# Patient Record
Sex: Female | Born: 1980 | Race: White | Hispanic: No | Marital: Married | Smoking: Former smoker
Health system: Southern US, Community
[De-identification: ages and names within clinical notes are randomized; demographics above are authoritative.]

## PROBLEM LIST (undated history)

## (undated) DIAGNOSIS — E039 Hypothyroidism, unspecified: Secondary | ICD-10-CM

## (undated) HISTORY — DX: Hypothyroidism, unspecified: E03.9

## (undated) HISTORY — PX: WISDOM TOOTH EXTRACTION: SHX21

---

## 2019-05-21 NOTE — L&D Delivery Note (Signed)
Delivery Note At 1:06 PM a viable female was delivered via Vaginal, Spontaneous (Presentation: Left Occiput Anterior).  APGAR: 8, 9; weight pending.   Placenta status: Manual removal, Intact; sent to pathology.  Cord: 2 vessels with the following complications: loose nuchal x 1; reduced.  Cord pH: n/a  Anesthesia: Epidural Episiotomy:  n/a Lacerations: Perineal;Labial;1st degree Suture Repair: 3.0 vicryl rapide Est. Blood Loss (mL):  350  Mom to postpartum.  Baby to Couplet care / Skin to Skin.  Mitchel Honour 01/25/2020, 1:59 PM

## 2019-06-03 ENCOUNTER — Ambulatory Visit: Payer: BC Managed Care – PPO | Attending: Internal Medicine

## 2019-06-03 DIAGNOSIS — Z20822 Contact with and (suspected) exposure to covid-19: Secondary | ICD-10-CM

## 2019-06-04 LAB — NOVEL CORONAVIRUS, NAA: SARS-CoV-2, NAA: NOT DETECTED

## 2019-06-14 DIAGNOSIS — E669 Obesity, unspecified: Secondary | ICD-10-CM | POA: Diagnosis not present

## 2019-06-14 DIAGNOSIS — N911 Secondary amenorrhea: Secondary | ICD-10-CM | POA: Diagnosis not present

## 2019-06-14 DIAGNOSIS — E039 Hypothyroidism, unspecified: Secondary | ICD-10-CM | POA: Diagnosis not present

## 2019-06-14 DIAGNOSIS — O09511 Supervision of elderly primigravida, first trimester: Secondary | ICD-10-CM | POA: Diagnosis not present

## 2019-06-22 DIAGNOSIS — Z3481 Encounter for supervision of other normal pregnancy, first trimester: Secondary | ICD-10-CM | POA: Diagnosis not present

## 2019-06-22 DIAGNOSIS — Z3685 Encounter for antenatal screening for Streptococcus B: Secondary | ICD-10-CM | POA: Diagnosis not present

## 2019-06-22 LAB — OB RESULTS CONSOLE ABO/RH: RH Type: POSITIVE

## 2019-06-22 LAB — OB RESULTS CONSOLE HEPATITIS B SURFACE ANTIGEN: Hepatitis B Surface Ag: NEGATIVE

## 2019-06-22 LAB — OB RESULTS CONSOLE GC/CHLAMYDIA
Chlamydia: NEGATIVE
Gonorrhea: NEGATIVE

## 2019-06-22 LAB — OB RESULTS CONSOLE ANTIBODY SCREEN: Antibody Screen: NEGATIVE

## 2019-06-22 LAB — OB RESULTS CONSOLE HIV ANTIBODY (ROUTINE TESTING): HIV: NONREACTIVE

## 2019-06-22 LAB — OB RESULTS CONSOLE RUBELLA ANTIBODY, IGM: Rubella: IMMUNE

## 2019-06-22 LAB — OB RESULTS CONSOLE RPR: RPR: NONREACTIVE

## 2019-07-08 DIAGNOSIS — Z3481 Encounter for supervision of other normal pregnancy, first trimester: Secondary | ICD-10-CM | POA: Diagnosis not present

## 2019-07-08 DIAGNOSIS — Z124 Encounter for screening for malignant neoplasm of cervix: Secondary | ICD-10-CM | POA: Diagnosis not present

## 2019-07-08 DIAGNOSIS — Z331 Pregnant state, incidental: Secondary | ICD-10-CM | POA: Diagnosis not present

## 2019-07-08 DIAGNOSIS — Z113 Encounter for screening for infections with a predominantly sexual mode of transmission: Secondary | ICD-10-CM | POA: Diagnosis not present

## 2019-07-19 DIAGNOSIS — Z3682 Encounter for antenatal screening for nuchal translucency: Secondary | ICD-10-CM | POA: Diagnosis not present

## 2019-07-19 DIAGNOSIS — Z3A12 12 weeks gestation of pregnancy: Secondary | ICD-10-CM | POA: Diagnosis not present

## 2019-07-28 DIAGNOSIS — E039 Hypothyroidism, unspecified: Secondary | ICD-10-CM | POA: Diagnosis not present

## 2019-08-10 DIAGNOSIS — E039 Hypothyroidism, unspecified: Secondary | ICD-10-CM | POA: Diagnosis not present

## 2019-08-10 DIAGNOSIS — Z331 Pregnant state, incidental: Secondary | ICD-10-CM | POA: Diagnosis not present

## 2019-08-16 DIAGNOSIS — M9903 Segmental and somatic dysfunction of lumbar region: Secondary | ICD-10-CM | POA: Diagnosis not present

## 2019-08-16 DIAGNOSIS — M9902 Segmental and somatic dysfunction of thoracic region: Secondary | ICD-10-CM | POA: Diagnosis not present

## 2019-08-16 DIAGNOSIS — R293 Abnormal posture: Secondary | ICD-10-CM | POA: Diagnosis not present

## 2019-08-16 DIAGNOSIS — M62830 Muscle spasm of back: Secondary | ICD-10-CM | POA: Diagnosis not present

## 2019-08-17 DIAGNOSIS — R293 Abnormal posture: Secondary | ICD-10-CM | POA: Diagnosis not present

## 2019-08-17 DIAGNOSIS — M9902 Segmental and somatic dysfunction of thoracic region: Secondary | ICD-10-CM | POA: Diagnosis not present

## 2019-08-17 DIAGNOSIS — M9903 Segmental and somatic dysfunction of lumbar region: Secondary | ICD-10-CM | POA: Diagnosis not present

## 2019-08-17 DIAGNOSIS — M62830 Muscle spasm of back: Secondary | ICD-10-CM | POA: Diagnosis not present

## 2019-08-23 DIAGNOSIS — M62830 Muscle spasm of back: Secondary | ICD-10-CM | POA: Diagnosis not present

## 2019-08-23 DIAGNOSIS — M9903 Segmental and somatic dysfunction of lumbar region: Secondary | ICD-10-CM | POA: Diagnosis not present

## 2019-08-23 DIAGNOSIS — M9902 Segmental and somatic dysfunction of thoracic region: Secondary | ICD-10-CM | POA: Diagnosis not present

## 2019-08-23 DIAGNOSIS — R293 Abnormal posture: Secondary | ICD-10-CM | POA: Diagnosis not present

## 2019-08-24 DIAGNOSIS — E039 Hypothyroidism, unspecified: Secondary | ICD-10-CM | POA: Diagnosis not present

## 2019-08-26 DIAGNOSIS — M9902 Segmental and somatic dysfunction of thoracic region: Secondary | ICD-10-CM | POA: Diagnosis not present

## 2019-08-26 DIAGNOSIS — M9903 Segmental and somatic dysfunction of lumbar region: Secondary | ICD-10-CM | POA: Diagnosis not present

## 2019-08-26 DIAGNOSIS — R293 Abnormal posture: Secondary | ICD-10-CM | POA: Diagnosis not present

## 2019-08-26 DIAGNOSIS — M62830 Muscle spasm of back: Secondary | ICD-10-CM | POA: Diagnosis not present

## 2019-08-30 DIAGNOSIS — R293 Abnormal posture: Secondary | ICD-10-CM | POA: Diagnosis not present

## 2019-08-30 DIAGNOSIS — M9903 Segmental and somatic dysfunction of lumbar region: Secondary | ICD-10-CM | POA: Diagnosis not present

## 2019-08-30 DIAGNOSIS — M9902 Segmental and somatic dysfunction of thoracic region: Secondary | ICD-10-CM | POA: Diagnosis not present

## 2019-08-30 DIAGNOSIS — M62830 Muscle spasm of back: Secondary | ICD-10-CM | POA: Diagnosis not present

## 2019-09-03 DIAGNOSIS — Z3481 Encounter for supervision of other normal pregnancy, first trimester: Secondary | ICD-10-CM | POA: Diagnosis not present

## 2019-09-03 DIAGNOSIS — Z363 Encounter for antenatal screening for malformations: Secondary | ICD-10-CM | POA: Diagnosis not present

## 2019-09-03 DIAGNOSIS — Z3A18 18 weeks gestation of pregnancy: Secondary | ICD-10-CM | POA: Diagnosis not present

## 2019-09-06 DIAGNOSIS — R293 Abnormal posture: Secondary | ICD-10-CM | POA: Diagnosis not present

## 2019-09-06 DIAGNOSIS — M62830 Muscle spasm of back: Secondary | ICD-10-CM | POA: Diagnosis not present

## 2019-09-06 DIAGNOSIS — M9902 Segmental and somatic dysfunction of thoracic region: Secondary | ICD-10-CM | POA: Diagnosis not present

## 2019-09-06 DIAGNOSIS — M9903 Segmental and somatic dysfunction of lumbar region: Secondary | ICD-10-CM | POA: Diagnosis not present

## 2019-09-20 DIAGNOSIS — E039 Hypothyroidism, unspecified: Secondary | ICD-10-CM | POA: Diagnosis not present

## 2019-09-21 DIAGNOSIS — R293 Abnormal posture: Secondary | ICD-10-CM | POA: Diagnosis not present

## 2019-09-21 DIAGNOSIS — M9902 Segmental and somatic dysfunction of thoracic region: Secondary | ICD-10-CM | POA: Diagnosis not present

## 2019-09-21 DIAGNOSIS — M62830 Muscle spasm of back: Secondary | ICD-10-CM | POA: Diagnosis not present

## 2019-09-21 DIAGNOSIS — M9903 Segmental and somatic dysfunction of lumbar region: Secondary | ICD-10-CM | POA: Diagnosis not present

## 2019-09-29 DIAGNOSIS — Z362 Encounter for other antenatal screening follow-up: Secondary | ICD-10-CM | POA: Diagnosis not present

## 2019-09-29 DIAGNOSIS — Z3A22 22 weeks gestation of pregnancy: Secondary | ICD-10-CM | POA: Diagnosis not present

## 2019-10-05 ENCOUNTER — Encounter: Payer: Self-pay | Admitting: *Deleted

## 2019-10-05 DIAGNOSIS — R293 Abnormal posture: Secondary | ICD-10-CM | POA: Diagnosis not present

## 2019-10-05 DIAGNOSIS — M62830 Muscle spasm of back: Secondary | ICD-10-CM | POA: Diagnosis not present

## 2019-10-05 DIAGNOSIS — M9903 Segmental and somatic dysfunction of lumbar region: Secondary | ICD-10-CM | POA: Diagnosis not present

## 2019-10-05 DIAGNOSIS — M9902 Segmental and somatic dysfunction of thoracic region: Secondary | ICD-10-CM | POA: Diagnosis not present

## 2019-10-06 ENCOUNTER — Other Ambulatory Visit: Payer: Self-pay

## 2019-10-06 ENCOUNTER — Other Ambulatory Visit: Payer: Self-pay | Admitting: *Deleted

## 2019-10-06 ENCOUNTER — Other Ambulatory Visit: Payer: Self-pay | Admitting: Obstetrics and Gynecology

## 2019-10-06 ENCOUNTER — Ambulatory Visit: Payer: BC Managed Care – PPO | Attending: Obstetrics and Gynecology | Admitting: *Deleted

## 2019-10-06 ENCOUNTER — Ambulatory Visit (HOSPITAL_BASED_OUTPATIENT_CLINIC_OR_DEPARTMENT_OTHER): Payer: BC Managed Care – PPO

## 2019-10-06 VITALS — BP 118/82 | HR 100 | Ht 63.0 in

## 2019-10-06 DIAGNOSIS — O99212 Obesity complicating pregnancy, second trimester: Secondary | ICD-10-CM | POA: Diagnosis not present

## 2019-10-06 DIAGNOSIS — O359XX0 Maternal care for (suspected) fetal abnormality and damage, unspecified, not applicable or unspecified: Secondary | ICD-10-CM

## 2019-10-06 DIAGNOSIS — E669 Obesity, unspecified: Secondary | ICD-10-CM | POA: Insufficient documentation

## 2019-10-06 DIAGNOSIS — O0990 Supervision of high risk pregnancy, unspecified, unspecified trimester: Secondary | ICD-10-CM

## 2019-10-06 DIAGNOSIS — O99282 Endocrine, nutritional and metabolic diseases complicating pregnancy, second trimester: Secondary | ICD-10-CM | POA: Diagnosis not present

## 2019-10-06 DIAGNOSIS — Q600 Renal agenesis, unilateral: Secondary | ICD-10-CM

## 2019-10-06 DIAGNOSIS — E039 Hypothyroidism, unspecified: Secondary | ICD-10-CM

## 2019-10-06 DIAGNOSIS — Z3A23 23 weeks gestation of pregnancy: Secondary | ICD-10-CM | POA: Insufficient documentation

## 2019-10-06 DIAGNOSIS — O358XX0 Maternal care for other (suspected) fetal abnormality and damage, not applicable or unspecified: Secondary | ICD-10-CM | POA: Diagnosis not present

## 2019-10-11 ENCOUNTER — Ambulatory Visit: Payer: BC Managed Care – PPO

## 2019-11-01 DIAGNOSIS — Z23 Encounter for immunization: Secondary | ICD-10-CM | POA: Diagnosis not present

## 2019-11-01 DIAGNOSIS — Z3480 Encounter for supervision of other normal pregnancy, unspecified trimester: Secondary | ICD-10-CM | POA: Diagnosis not present

## 2019-12-01 ENCOUNTER — Ambulatory Visit: Payer: BC Managed Care – PPO | Admitting: *Deleted

## 2019-12-01 ENCOUNTER — Ambulatory Visit: Payer: BC Managed Care – PPO | Attending: Maternal & Fetal Medicine

## 2019-12-01 ENCOUNTER — Other Ambulatory Visit: Payer: Self-pay

## 2019-12-01 VITALS — BP 120/81 | HR 103

## 2019-12-01 DIAGNOSIS — O99283 Endocrine, nutritional and metabolic diseases complicating pregnancy, third trimester: Secondary | ICD-10-CM

## 2019-12-01 DIAGNOSIS — E039 Hypothyroidism, unspecified: Secondary | ICD-10-CM | POA: Diagnosis not present

## 2019-12-01 DIAGNOSIS — Q600 Renal agenesis, unilateral: Secondary | ICD-10-CM | POA: Diagnosis not present

## 2019-12-01 DIAGNOSIS — O0990 Supervision of high risk pregnancy, unspecified, unspecified trimester: Secondary | ICD-10-CM | POA: Insufficient documentation

## 2019-12-01 DIAGNOSIS — O359XX0 Maternal care for (suspected) fetal abnormality and damage, unspecified, not applicable or unspecified: Secondary | ICD-10-CM | POA: Diagnosis not present

## 2019-12-01 DIAGNOSIS — Z362 Encounter for other antenatal screening follow-up: Secondary | ICD-10-CM | POA: Diagnosis not present

## 2019-12-01 DIAGNOSIS — O99213 Obesity complicating pregnancy, third trimester: Secondary | ICD-10-CM

## 2019-12-01 DIAGNOSIS — Z3A31 31 weeks gestation of pregnancy: Secondary | ICD-10-CM

## 2019-12-08 DIAGNOSIS — E039 Hypothyroidism, unspecified: Secondary | ICD-10-CM | POA: Diagnosis not present

## 2019-12-28 DIAGNOSIS — E039 Hypothyroidism, unspecified: Secondary | ICD-10-CM | POA: Diagnosis not present

## 2020-01-04 DIAGNOSIS — Z3685 Encounter for antenatal screening for Streptococcus B: Secondary | ICD-10-CM | POA: Diagnosis not present

## 2020-01-04 DIAGNOSIS — O26849 Uterine size-date discrepancy, unspecified trimester: Secondary | ICD-10-CM | POA: Diagnosis not present

## 2020-01-04 DIAGNOSIS — Z3A36 36 weeks gestation of pregnancy: Secondary | ICD-10-CM | POA: Diagnosis not present

## 2020-01-04 LAB — OB RESULTS CONSOLE GBS: GBS: NEGATIVE

## 2020-01-07 DIAGNOSIS — E039 Hypothyroidism, unspecified: Secondary | ICD-10-CM | POA: Diagnosis not present

## 2020-01-07 DIAGNOSIS — E063 Autoimmune thyroiditis: Secondary | ICD-10-CM | POA: Diagnosis not present

## 2020-01-07 DIAGNOSIS — Z331 Pregnant state, incidental: Secondary | ICD-10-CM | POA: Diagnosis not present

## 2020-01-18 ENCOUNTER — Encounter (HOSPITAL_COMMUNITY): Payer: Self-pay | Admitting: *Deleted

## 2020-01-18 ENCOUNTER — Telehealth (HOSPITAL_COMMUNITY): Payer: Self-pay | Admitting: *Deleted

## 2020-01-18 NOTE — Telephone Encounter (Signed)
Preadmission screen  

## 2020-01-22 ENCOUNTER — Other Ambulatory Visit (HOSPITAL_COMMUNITY)
Admission: RE | Admit: 2020-01-22 | Discharge: 2020-01-22 | Disposition: A | Discharge status: Home / Self Care | Payer: BC Managed Care – PPO | Source: Ambulatory Visit | Attending: Obstetrics and Gynecology | Admitting: Obstetrics and Gynecology

## 2020-01-22 DIAGNOSIS — Z3A00 Weeks of gestation of pregnancy not specified: Secondary | ICD-10-CM | POA: Diagnosis not present

## 2020-01-22 DIAGNOSIS — O99284 Endocrine, nutritional and metabolic diseases complicating childbirth: Secondary | ICD-10-CM | POA: Diagnosis not present

## 2020-01-22 DIAGNOSIS — Z91041 Radiographic dye allergy status: Secondary | ICD-10-CM | POA: Diagnosis not present

## 2020-01-22 DIAGNOSIS — O99214 Obesity complicating childbirth: Secondary | ICD-10-CM | POA: Diagnosis not present

## 2020-01-22 DIAGNOSIS — O26893 Other specified pregnancy related conditions, third trimester: Secondary | ICD-10-CM | POA: Diagnosis not present

## 2020-01-22 DIAGNOSIS — O43893 Other placental disorders, third trimester: Secondary | ICD-10-CM | POA: Diagnosis not present

## 2020-01-22 DIAGNOSIS — Z3A39 39 weeks gestation of pregnancy: Secondary | ICD-10-CM | POA: Diagnosis not present

## 2020-01-22 DIAGNOSIS — Z01812 Encounter for preprocedural laboratory examination: Secondary | ICD-10-CM | POA: Insufficient documentation

## 2020-01-22 DIAGNOSIS — O358XX0 Maternal care for other (suspected) fetal abnormality and damage, not applicable or unspecified: Secondary | ICD-10-CM | POA: Diagnosis not present

## 2020-01-22 DIAGNOSIS — Z20822 Contact with and (suspected) exposure to covid-19: Secondary | ICD-10-CM | POA: Diagnosis not present

## 2020-01-22 DIAGNOSIS — Z87891 Personal history of nicotine dependence: Secondary | ICD-10-CM | POA: Diagnosis not present

## 2020-01-22 DIAGNOSIS — E039 Hypothyroidism, unspecified: Secondary | ICD-10-CM | POA: Diagnosis not present

## 2020-01-22 LAB — SARS CORONAVIRUS 2 (TAT 6-24 HRS): SARS Coronavirus 2: NEGATIVE

## 2020-01-25 ENCOUNTER — Inpatient Hospital Stay (HOSPITAL_COMMUNITY): Payer: BC Managed Care – PPO

## 2020-01-25 ENCOUNTER — Inpatient Hospital Stay (HOSPITAL_COMMUNITY): Payer: BC Managed Care – PPO | Admitting: Anesthesiology

## 2020-01-25 ENCOUNTER — Other Ambulatory Visit: Payer: Self-pay

## 2020-01-25 ENCOUNTER — Inpatient Hospital Stay (HOSPITAL_COMMUNITY)
Admission: AD | Admit: 2020-01-25 | Discharge: 2020-01-27 | DRG: 807 | Disposition: A | Discharge status: Home / Self Care | Payer: BC Managed Care – PPO | Attending: Obstetrics & Gynecology | Admitting: Obstetrics & Gynecology

## 2020-01-25 ENCOUNTER — Encounter (HOSPITAL_COMMUNITY): Payer: Self-pay | Admitting: Obstetrics & Gynecology

## 2020-01-25 DIAGNOSIS — Z87891 Personal history of nicotine dependence: Secondary | ICD-10-CM | POA: Diagnosis not present

## 2020-01-25 DIAGNOSIS — O99284 Endocrine, nutritional and metabolic diseases complicating childbirth: Secondary | ICD-10-CM | POA: Diagnosis present

## 2020-01-25 DIAGNOSIS — E039 Hypothyroidism, unspecified: Secondary | ICD-10-CM | POA: Diagnosis present

## 2020-01-25 DIAGNOSIS — Z20822 Contact with and (suspected) exposure to covid-19: Secondary | ICD-10-CM | POA: Diagnosis present

## 2020-01-25 DIAGNOSIS — Z91041 Radiographic dye allergy status: Secondary | ICD-10-CM

## 2020-01-25 DIAGNOSIS — Z3A39 39 weeks gestation of pregnancy: Secondary | ICD-10-CM | POA: Diagnosis not present

## 2020-01-25 DIAGNOSIS — O26893 Other specified pregnancy related conditions, third trimester: Secondary | ICD-10-CM | POA: Diagnosis present

## 2020-01-25 DIAGNOSIS — O358XX0 Maternal care for other (suspected) fetal abnormality and damage, not applicable or unspecified: Principal | ICD-10-CM | POA: Diagnosis present

## 2020-01-25 DIAGNOSIS — O99214 Obesity complicating childbirth: Secondary | ICD-10-CM | POA: Diagnosis present

## 2020-01-25 DIAGNOSIS — Z3490 Encounter for supervision of normal pregnancy, unspecified, unspecified trimester: Secondary | ICD-10-CM

## 2020-01-25 LAB — CBC
HCT: 35.9 % — ABNORMAL LOW (ref 36.0–46.0)
Hemoglobin: 12.4 g/dL (ref 12.0–15.0)
MCH: 30.2 pg (ref 26.0–34.0)
MCHC: 34.5 g/dL (ref 30.0–36.0)
MCV: 87.6 fL (ref 80.0–100.0)
Platelets: 292 10*3/uL (ref 150–400)
RBC: 4.1 MIL/uL (ref 3.87–5.11)
RDW: 13 % (ref 11.5–15.5)
WBC: 11 10*3/uL — ABNORMAL HIGH (ref 4.0–10.5)
nRBC: 0 % (ref 0.0–0.2)

## 2020-01-25 LAB — TYPE AND SCREEN
ABO/RH(D): A POS
Antibody Screen: NEGATIVE

## 2020-01-25 LAB — RPR: RPR Ser Ql: NONREACTIVE

## 2020-01-25 MED ORDER — OXYTOCIN-SODIUM CHLORIDE 30-0.9 UT/500ML-% IV SOLN: 2.5000 [IU]/h | INTRAVENOUS | Status: DC

## 2020-01-25 MED ORDER — PHENYLEPHRINE 40 MCG/ML (10ML) SYRINGE FOR IV PUSH (FOR BLOOD PRESSURE SUPPORT): 80.0000 ug | PREFILLED_SYRINGE | INTRAVENOUS | Status: DC | PRN

## 2020-01-25 MED ORDER — IBUPROFEN 600 MG PO TABS
600.0000 mg | ORAL_TABLET | Freq: Four times a day (QID) | ORAL | Status: DC
  Administered 2020-01-25 – 2020-01-27 (×8): 600 mg via ORAL
  Filled 2020-01-25 (×8): qty 1

## 2020-01-25 MED ORDER — OXYCODONE-ACETAMINOPHEN 5-325 MG PO TABS: 1.0000 | ORAL_TABLET | ORAL | Status: DC | PRN

## 2020-01-25 MED ORDER — LACTATED RINGERS IV SOLN: INTRAVENOUS | Status: DC

## 2020-01-25 MED ORDER — LEVOTHYROXINE SODIUM 75 MCG PO TABS
150.0000 ug | ORAL_TABLET | Freq: Every day | ORAL | Status: DC
  Administered 2020-01-26 – 2020-01-27 (×2): 150 ug via ORAL
  Filled 2020-01-25 (×2): qty 2

## 2020-01-25 MED ORDER — ACETAMINOPHEN 325 MG PO TABS
650.0000 mg | ORAL_TABLET | ORAL | Status: DC | PRN
  Administered 2020-01-25 – 2020-01-26 (×3): 650 mg via ORAL
  Filled 2020-01-25 (×3): qty 2

## 2020-01-25 MED ORDER — ACETAMINOPHEN 325 MG PO TABS: 650.0000 mg | ORAL_TABLET | ORAL | Status: DC | PRN

## 2020-01-25 MED ORDER — OXYCODONE-ACETAMINOPHEN 5-325 MG PO TABS: 2.0000 | ORAL_TABLET | ORAL | Status: DC | PRN

## 2020-01-25 MED ORDER — DIPHENHYDRAMINE HCL 50 MG/ML IJ SOLN: 12.5000 mg | INTRAMUSCULAR | Status: DC | PRN

## 2020-01-25 MED ORDER — EPHEDRINE 5 MG/ML INJ: 10.0000 mg | INTRAVENOUS | Status: DC | PRN

## 2020-01-25 MED ORDER — SENNOSIDES-DOCUSATE SODIUM 8.6-50 MG PO TABS
2.0000 | ORAL_TABLET | ORAL | Status: DC
  Administered 2020-01-26 (×2): 2 via ORAL
  Filled 2020-01-25 (×2): qty 2

## 2020-01-25 MED ORDER — OXYTOCIN BOLUS FROM INFUSION
333.0000 mL | Freq: Once | INTRAVENOUS | Status: AC
  Administered 2020-01-25: 333 mL via INTRAVENOUS

## 2020-01-25 MED ORDER — DIBUCAINE (PERIANAL) 1 % EX OINT: 1.0000 "application " | TOPICAL_OINTMENT | CUTANEOUS | Status: DC | PRN

## 2020-01-25 MED ORDER — LEVOTHYROXINE SODIUM 75 MCG PO TABS
150.0000 ug | ORAL_TABLET | Freq: Every morning | ORAL | Status: DC
  Administered 2020-01-25: 150 ug via ORAL
  Filled 2020-01-25: qty 1

## 2020-01-25 MED ORDER — FENTANYL CITRATE (PF) 100 MCG/2ML IJ SOLN: 50.0000 ug | INTRAMUSCULAR | Status: DC | PRN

## 2020-01-25 MED ORDER — COCONUT OIL OIL
1.0000 "application " | TOPICAL_OIL | Status: DC | PRN
  Administered 2020-01-26: 1 via TOPICAL

## 2020-01-25 MED ORDER — DIPHENHYDRAMINE HCL 25 MG PO CAPS: 25.0000 mg | ORAL_CAPSULE | Freq: Four times a day (QID) | ORAL | Status: DC | PRN

## 2020-01-25 MED ORDER — BUPIVACAINE HCL (PF) 0.75 % IJ SOLN
INTRAMUSCULAR | Status: DC | PRN
Start: 2020-01-25 — End: 2020-01-25
  Administered 2020-01-25: 12 mL/h via EPIDURAL

## 2020-01-25 MED ORDER — PRENATAL MULTIVITAMIN CH
1.0000 | ORAL_TABLET | Freq: Every day | ORAL | Status: DC
  Administered 2020-01-26 – 2020-01-27 (×2): 1 via ORAL
  Filled 2020-01-25 (×2): qty 1

## 2020-01-25 MED ORDER — BENZOCAINE-MENTHOL 20-0.5 % EX AERO
1.0000 "application " | INHALATION_SPRAY | CUTANEOUS | Status: DC | PRN
  Administered 2020-01-25: 1 via TOPICAL
  Filled 2020-01-25: qty 56

## 2020-01-25 MED ORDER — ONDANSETRON HCL 4 MG PO TABS: 4.0000 mg | ORAL_TABLET | ORAL | Status: DC | PRN

## 2020-01-25 MED ORDER — WITCH HAZEL-GLYCERIN EX PADS: 1.0000 "application " | MEDICATED_PAD | CUTANEOUS | Status: DC | PRN

## 2020-01-25 MED ORDER — LACTATED RINGERS IV SOLN: 500.0000 mL | INTRAVENOUS | Status: DC | PRN

## 2020-01-25 MED ORDER — TETANUS-DIPHTH-ACELL PERTUSSIS 5-2.5-18.5 LF-MCG/0.5 IM SUSP: 0.5000 mL | Freq: Once | INTRAMUSCULAR | Status: DC

## 2020-01-25 MED ORDER — MISOPROSTOL 25 MCG QUARTER TABLET
25.0000 ug | ORAL_TABLET | ORAL | Status: DC | PRN
  Administered 2020-01-25 (×2): 25 ug via VAGINAL
  Filled 2020-01-25 (×2): qty 1

## 2020-01-25 MED ORDER — SIMETHICONE 80 MG PO CHEW: 80.0000 mg | CHEWABLE_TABLET | ORAL | Status: DC | PRN

## 2020-01-25 MED ORDER — OXYTOCIN-SODIUM CHLORIDE 30-0.9 UT/500ML-% IV SOLN
1.0000 m[IU]/min | INTRAVENOUS | Status: DC
  Administered 2020-01-25: 2 m[IU]/min via INTRAVENOUS
  Filled 2020-01-25: qty 500

## 2020-01-25 MED ORDER — ZOLPIDEM TARTRATE 5 MG PO TABS: 5.0000 mg | ORAL_TABLET | Freq: Every evening | ORAL | Status: DC | PRN

## 2020-01-25 MED ORDER — ONDANSETRON HCL 4 MG/2ML IJ SOLN: 4.0000 mg | Freq: Four times a day (QID) | INTRAMUSCULAR | Status: DC | PRN

## 2020-01-25 MED ORDER — ONDANSETRON HCL 4 MG/2ML IJ SOLN: 4.0000 mg | INTRAMUSCULAR | Status: DC | PRN

## 2020-01-25 MED ORDER — TERBUTALINE SULFATE 1 MG/ML IJ SOLN: 0.2500 mg | Freq: Once | INTRAMUSCULAR | Status: DC | PRN

## 2020-01-25 MED ORDER — LIDOCAINE HCL (PF) 1 % IJ SOLN
INTRAMUSCULAR | Status: DC | PRN
  Administered 2020-01-25: 8 mL via EPIDURAL

## 2020-01-25 MED ORDER — ZOLPIDEM TARTRATE 5 MG PO TABS
5.0000 mg | ORAL_TABLET | Freq: Every evening | ORAL | Status: DC | PRN
  Administered 2020-01-25: 5 mg via ORAL
  Filled 2020-01-25: qty 1

## 2020-01-25 MED ORDER — SOD CITRATE-CITRIC ACID 500-334 MG/5ML PO SOLN: 30.0000 mL | ORAL | Status: DC | PRN

## 2020-01-25 MED ORDER — PHENYLEPHRINE 40 MCG/ML (10ML) SYRINGE FOR IV PUSH (FOR BLOOD PRESSURE SUPPORT)
80.0000 ug | PREFILLED_SYRINGE | INTRAVENOUS | Status: DC | PRN
  Filled 2020-01-25: qty 10

## 2020-01-25 MED ORDER — LACTATED RINGERS IV SOLN: 500.0000 mL | Freq: Once | INTRAVENOUS | Status: DC

## 2020-01-25 MED ORDER — FENTANYL-BUPIVACAINE-NACL 0.5-0.125-0.9 MG/250ML-% EP SOLN
12.0000 mL/h | EPIDURAL | Status: DC | PRN
  Filled 2020-01-25: qty 250

## 2020-01-25 MED ORDER — LIDOCAINE HCL (PF) 1 % IJ SOLN: 30.0000 mL | INTRAMUSCULAR | Status: DC | PRN

## 2020-01-25 NOTE — H&P (Signed)
Tiffany Stark is a 39 y.o. female [phone] at [redacted]w[redacted]d presenting for IOL.  Patient received second dose of VMP at 0513.  She just received her epidural and is starting to feel relief.  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  She is s/p MFM referral for this indication.  Patient has h/o PPD.  GBS negative.   OB History    Gravida  4   Para  1   Term  1   Preterm      AB  2   Living  1     SAB  1   TAB  1   Ectopic      Multiple      Live Births  1          Past Medical History:  Diagnosis Date  . Hypothyroidism    Past Surgical History:  Procedure Laterality Date  . WISDOM TOOTH EXTRACTION     Family History: family history includes Heart disease in her maternal grandmother; Hypertension in her father and maternal grandmother. Social History:  reports that she has quit smoking. She has never used smokeless tobacco. She reports previous alcohol use. She reports that she does not use drugs.     Maternal Diabetes: No Genetic Screening: Normal Maternal Ultrasounds/Referrals: Fetal Kidney Anomalies and Other: SUA and absent right kidney Fetal Ultrasounds or other Referrals:  Referred to Materal Fetal Medicine  Maternal Substance Abuse:  No Significant Maternal Medications:  Meds include: Syntroid Significant Maternal Lab Results:  Group B Strep negative Other Comments:  None  Review of Systems Maternal Medical History:  Contractions: Onset was 3-5 hours ago.   Perceived severity is mild.    Fetal activity: Perceived fetal activity is normal.   Last perceived fetal movement was within the past hour.    Prenatal complications: no prenatal complications Prenatal Complications - Diabetes: none.    Dilation: 2 Effacement (%): 50 Station: -2 Exam by:: B boyer RN  Blood pressure 111/80, pulse 89, temperature 98.7 F (37.1 C), temperature source Oral,  resp. rate 17, height 5\' 3"  (1.6 m), weight 128.1 kg, last menstrual period 04/25/2019, SpO2 100 %. Maternal Exam:  Uterine Assessment: Contraction strength is mild.  Contraction frequency is irregular.   Abdomen: Patient reports no abdominal tenderness. Fundal height is c/w dates.   Estimated fetal weight is 7#.       Fetal Exam Fetal Monitor Review: Baseline rate: 135.  Variability: moderate (6-25 bpm).   Pattern: accelerations present and no decelerations.    Fetal State Assessment: Category I - tracings are normal.     Physical Exam Constitutional:      Appearance: Normal appearance.  HENT:     Head: Normocephalic and atraumatic.  Pulmonary:     Effort: Pulmonary effort is normal.  Abdominal:     Palpations: Abdomen is soft.  Musculoskeletal:     Cervical back: Normal range of motion.  Skin:    General: Skin is warm and dry.  Neurological:     Mental Status: She is alert.  Psychiatric:        Mood and Affect: Mood normal.        Behavior: Behavior normal.     Prenatal labs: ABO, Rh: --/--/A POS (09/07 0050) Antibody: NEG (09/07 0050) Rubella: Immune (02/02 0000) RPR: Nonreactive (02/02 0000)  HBsAg: Negative (02/02 0000)  HIV: Non-reactive (02/02 0000)  GBS: Negative/-- (08/17  0000)   Assessment/Plan: 16XI [id number] at [redacted]w[redacted]d for IOL -Recheck cervix around 915 and consider AROM/pitocin -Anticipate NSVD   Mitchel Honour 01/25/2020, 7:46 AM

## 2020-01-25 NOTE — Anesthesia Preprocedure Evaluation (Signed)
Anesthesia Evaluation  Patient identified by MRN, date of birth, ID band  Reviewed: Allergy & Precautions, NPO status , Patient's Chart, lab work & pertinent test results  Airway Mallampati: III  TM Distance: >3 FB Neck ROM: Full    Dental no notable dental hx.    Pulmonary neg pulmonary ROS, former smoker,    Pulmonary exam normal breath sounds clear to auscultation       Cardiovascular Exercise Tolerance: Good negative cardio ROS Normal cardiovascular exam Rhythm:Regular Rate:Normal     Neuro/Psych negative neurological ROS  negative psych ROS   GI/Hepatic negative GI ROS, Neg liver ROS,   Endo/Other  Hypothyroidism Morbid obesity  Renal/GU negative Renal ROS  negative genitourinary   Musculoskeletal negative musculoskeletal ROS (+)   Abdominal (+) + obese,   Peds  Hematology negative hematology ROS (+)   Anesthesia Other Findings   Reproductive/Obstetrics (+) Pregnancy                             Anesthesia Physical Anesthesia Plan  ASA: IV  Anesthesia Plan: Epidural   Post-op Pain Management:    Induction:   PONV Risk Score and Plan: 2 and Treatment may vary due to age or medical condition  Airway Management Planned:   Additional Equipment:   Intra-op Plan:   Post-operative Plan:   Informed Consent: I have reviewed the patients History and Physical, chart, labs and discussed the procedure including the risks, benefits and alternatives for the proposed anesthesia with the patient or authorized representative who has indicated his/her understanding and acceptance.       Plan Discussed with:   Anesthesia Plan Comments:         Anesthesia Quick Evaluation

## 2020-01-25 NOTE — Anesthesia Procedure Notes (Signed)
Epidural Patient location during procedure: OB Start time: 01/25/2020 7:10 AM End time: 01/25/2020 7:17 AM  Staffing Anesthesiologist: Mellody Dance, MD Performed: anesthesiologist   Preanesthetic Checklist Completed: patient identified, IV checked, site marked, risks and benefits discussed, monitors and equipment checked, pre-op evaluation and timeout performed  Epidural Patient position: sitting Prep: DuraPrep Patient monitoring: heart rate, cardiac monitor, continuous pulse ox and blood pressure Approach: midline Location: L3-L4 Injection technique: LOR saline  Needle:  Needle type: Tuohy  Needle gauge: 17 G Needle length: 9 cm Needle insertion depth: 6 cm Catheter type: closed end flexible Catheter size: 20 Guage Catheter at skin depth: 11 cm Test dose: negative and Other  Assessment Events: blood not aspirated, injection not painful, no injection resistance and negative IV test  Additional Notes Informed consent obtained prior to proceeding including risk of failure, 1% risk of PDPH, risk of minor discomfort and bruising.  Discussed rare but serious complications including epidural abscess, permanent nerve injury, epidural hematoma.  Discussed alternatives to epidural analgesia and patient desires to proceed.  Timeout performed pre-procedure verifying patient name, procedure, and platelet count.  Patient tolerated procedure well.

## 2020-01-25 NOTE — Lactation Note (Signed)
This note was copied from a baby's chart. Lactation Consultation Note Baby 9 hrs old. Mom has 38 yr old that she BF for 1 yr. Mom's Rt. Nipple inverted at rest, flat w/stimulation. Compressible. Lt. Nipple short shaft everted compressible nipple. Mom stated baby has been latching to Lt. Breast fine.  LC assisted in latching to Rt. Breast.encouraged STS. Suggested football position to obtain a deeper latch. Mom stated she really didn't like football position. LC stated OK. Mom tried latching to Rt. Breast but unable to in cradle position. Mom placed baby in football position. Baby latched several times suckle a few times then hold nipple in her mouth.  Suggest pre-pumping to pull nipple out before latching. Nipple everts but flattens at rest. Suggested shells, mom stated she has a pair of shells strongly encouraged mom to wear the shells to help evert nipples.   Mom hand expresses colostrum to tip of nipple when trying to latch baby. Baby frustrated d/t can't feel nipple in her mouth. Mom placed baby in cradle position to Lt. Nipple. Baby wouldn't latch. Mom laid baby on her chest STS stated she would try again later.  Mom stated she mainly BF to the Lt. Nipple w/her son and pumped the Rt. Nipple. suggest pumping. Mom stated she will use the hand pump for right now.  Newborn feeding habits, behavior, importance of STS, I&O, breast massage, supply and demand discussed. Mom seems a little frustrated right now. Encouraged mom to call for assistance or questions. Lactation brochure given.  Patient Name: Girl Leshea Jaggers [id number] Date: 01/25/2020 Reason for consult: Initial assessment;Term;Maternal endocrine disorder Type of Endocrine Disorder?: Thyroid   Maternal Data Has patient been taught Hand Expression?: Yes Does the patient have breastfeeding experience prior to this delivery?: Yes  Feeding Feeding Type: Breast Fed  LATCH Score Latch: Repeated attempts needed to sustain  latch, nipple held in mouth throughout feeding, stimulation needed to elicit sucking reflex.  Audible Swallowing: None  Type of Nipple: Inverted (inverted Rt. nipple at rest/everted Lt. nipple)  Comfort (Breast/Nipple): Soft / non-tender  Hold (Positioning): Assistance needed to correctly position infant at breast and maintain latch.  LATCH Score: 4  Interventions Interventions: Breast feeding basics reviewed;Breast compression;Assisted with latch;Adjust position;Hand pump;Skin to skin;Support pillows;Breast massage;Position options;Hand express;Pre-pump if needed (mom states she has shells)  Lactation Tools Discussed/Used Tools: Pump Shell Type: Inverted (mom states she has shells) Breast pump type: Manual WIC Program: No Pump Review: Setup, frequency, and cleaning;Milk Storage Initiated by:: Peri Jefferson RN IBCLC Date initiated:: 01/25/20   Consult Status Consult Status: Follow-up Date: 01/26/20 Follow-up type: In-patient    Charyl Dancer 01/25/2020, 10:52 PM

## 2020-01-25 NOTE — Progress Notes (Signed)
Comfortable with CLEA FHT Cat I SVE 3-4/50/-2, AROM, clear Will start pitocin.  Mitchel Honour, DO

## 2020-01-26 LAB — CBC
HCT: 33 % — ABNORMAL LOW (ref 36.0–46.0)
Hemoglobin: 10.8 g/dL — ABNORMAL LOW (ref 12.0–15.0)
MCH: 29 pg (ref 26.0–34.0)
MCHC: 32.7 g/dL (ref 30.0–36.0)
MCV: 88.7 fL (ref 80.0–100.0)
Platelets: 247 10*3/uL (ref 150–400)
RBC: 3.72 MIL/uL — ABNORMAL LOW (ref 3.87–5.11)
RDW: 13 % (ref 11.5–15.5)
WBC: 15.1 10*3/uL — ABNORMAL HIGH (ref 4.0–10.5)
nRBC: 0 % (ref 0.0–0.2)

## 2020-01-26 NOTE — Progress Notes (Signed)
CSW received consult for hx of postpartum depression.  CSW met with MOB to offer support and complete assessment.    CSW met with MOB at bedside to complete assessment, FOB present. MOB was welcoming, pleasant and remained engaged during assessment. FOB was an active participant in assessment as well. CSW and parents discussed MOB's postpartum depression experience. MOB reported that her postpartum depression started when she went back to work and described her symptoms as not wanting to come home from work and provide care to infant. FOB reported that MOB's postpartum depression lasted around 4-5 months and he had concerns about her safety during that time. MOB reported that she was treated with medication and therapy. FOB reported that he felt MOB needed more treatment than she received. MOB reported that she didn't remember her experience well because it seven years ago. CSW and parents discussed MOB's mental health history. FOB reported that MOB has experienced depression for years and has been treated on and off with Prozac. MOB reported that she has been off the Prozac for a year and denied any current depressive symptoms. CSW inquired about how MOB was feeling emotionally after giving birth, MOB reported that she was feeling good. MOB presented calm and did not demonstrate any acute mental health signs/symptoms. FOB had great insight about MOB's mental health history. CSW assessed for safety, MOB denied SI and HI. CSW did not assess for domestic violence since FOB was present.   CSW provided education regarding the baby blues period vs. perinatal mood disorders, discussed treatment and gave resources for mental health follow up if concerns arise.  CSW recommends self-evaluation during the postpartum time period using the New Mom Checklist from Postpartum Progress and encouraged MOB to contact a medical professional if symptoms are noted at any time.   CSW provided review of Sudden Infant Death Syndrome  (SIDS) precautions. Parents verbalized understanding and reported having all essential items needed to care for infant including a car seat and co-sleeper basinet.   CSW identifies no further need for intervention and no barriers to discharge at this time.  Veva Grimley, LCSW Clinical Social Worker [facility] Cell#: [phone] 

## 2020-01-26 NOTE — Progress Notes (Signed)
Post Partum Day 1 Subjective: no complaints, up ad lib, voiding and tolerating PO  Objective: Blood pressure 116/69, pulse 69, temperature 98 F (36.7 C), temperature source Oral, resp. rate 18, height 5\' 3"  (1.6 m), weight 128.1 kg, last menstrual period 04/25/2019, SpO2 99 %, unknown if currently breastfeeding.  Physical Exam:  General: alert Lochia: appropriate Uterine Fundus: firm Incision: n/a DVT Evaluation: No evidence of DVT seen on physical exam.  Recent Labs    01/25/20 0108 01/26/20 0503  HGB 12.4 10.8*  HCT 35.9* 33.0*    Assessment/Plan: Plan for discharge tomorrow and Breastfeeding   LOS: 1 day   Tiffany Stark 01/26/2020, 8:27 AM

## 2020-01-26 NOTE — Anesthesia Postprocedure Evaluation (Signed)
Anesthesia Post Note  Patient: Tiffany Stark  Procedure(s) Performed: AN AD HOC LABOR EPIDURAL     Patient location during evaluation: Mother Baby Anesthesia Type: Epidural Level of consciousness: awake Pain management: satisfactory to patient Vital Signs Assessment: post-procedure vital signs reviewed and stable Respiratory status: spontaneous breathing Cardiovascular status: stable Anesthetic complications: no   No complications documented.  Last Vitals:  Vitals:   01/25/20 2357 01/26/20 0422  BP: 121/84 116/69  Pulse: 88 69  Resp: 16 18  Temp: 36.7 C 36.7 C  SpO2: 97% 99%    Last Pain:  Vitals:   01/26/20 0527  TempSrc:   PainSc: 4    Pain Goal:                   [name]

## 2020-01-27 LAB — SURGICAL PATHOLOGY

## 2020-01-27 MED ORDER — ACETAMINOPHEN 325 MG PO TABS: 650.0000 mg | ORAL_TABLET | Freq: Four times a day (QID) | ORAL | 0 refills | Status: AC | PRN

## 2020-01-27 MED ORDER — IBUPROFEN 600 MG PO TABS: 600.0000 mg | ORAL_TABLET | Freq: Four times a day (QID) | ORAL | 0 refills | Status: AC | PRN

## 2020-01-27 NOTE — Discharge Summary (Signed)
Postpartum Discharge Summary  Date of Service updated 01/27/20     Patient Name: Tiffany Stark DOB: 13-Oct-1980 MRN: [id number]  Date of admission: 01/25/2020 Delivery date:01/25/2020  Delivering provider: Linda Hedges  Date of discharge: 01/27/2020  Admitting diagnosis: Pregnancy [Z34.90] Intrauterine pregnancy: [redacted]w[redacted]d    Secondary diagnosis:  Active Problems:   Pregnancy  Additional problems:     Discharge diagnosis: Term Pregnancy Delivered                                              Post partum procedures: Augmentation: AROM, Pitocin and Cytotec Complications: None  Hospital course: Induction of Labor With Vaginal Delivery   39y.o. yo [id number] admitted to the hospital 01/25/2020 for induction of labor.  Indication for induction: Elective.  Patient had an uncomplicated labor course as follows: Membrane Rupture Time/Date: 9:23 AM ,01/25/2020   Delivery Method:Vaginal, Spontaneous  Episiotomy:   Lacerations:  Perineal;Labial;1st degree  Details of delivery can be found in separate delivery note.  Patient had a routine postpartum course. Patient is discharged home 01/27/20.  Newborn Data: Birth date:01/25/2020  Birth time:1:06 PM  Gender:Female  Living status:Living  Apgars:8 ,9  Weight:3666 g   Magnesium Sulfate received: No BMZ received: No Rhophylac:No MMR:No T-DaP:Given prenatally Flu: No Transfusion:No  Physical exam  Vitals:   01/26/20 0422 01/26/20 1530 01/26/20 2044 01/27/20 0518  BP: 116/69 115/77 126/60 120/81  Pulse: 69 92 96 81  Resp: 18 18 18 18   Temp: 98 F (36.7 C) 97.6 F (36.4 C) 98.2 F (36.8 C) 98.7 F (37.1 C)  TempSrc: Oral Oral Oral Oral  SpO2: 99%  98% 98%  Weight:      Height:       General: alert, cooperative and no distress Lochia: appropriate Uterine Fundus: firm Incision: Healing well with no significant drainage DVT Evaluation: No evidence of DVT seen on physical exam. Labs: Lab Results  Component Value  Date   WBC 15.1 (H) 01/26/2020   HGB 10.8 (L) 01/26/2020   HCT 33.0 (L) 01/26/2020   MCV 88.7 01/26/2020   PLT 247 01/26/2020   No flowsheet data found. Edinburgh Score: Edinburgh Postnatal Depression Scale Screening Tool 01/25/2020  I have been able to laugh and see the funny side of things. 0  I have looked forward with enjoyment to things. 0  I have blamed myself unnecessarily when things went wrong. 0  I have been anxious or worried for no good reason. 1  I have felt scared or panicky for no good reason. 1  Things have been getting on top of me. 1  I have been so unhappy that I have had difficulty sleeping. 0  I have felt sad or miserable. 0  I have been so unhappy that I have been crying. 1  The thought of harming myself has occurred to me. 0  Edinburgh Postnatal Depression Scale Total 4      After visit meds:  Allergies as of 01/27/2020      Reactions   Iodine    Sulfa Antibiotics       Medication List    STOP taking these medications   MAGNESIUM PO     TAKE these medications   acetaminophen 325 MG tablet Commonly known as: Tylenol Take 2 tablets (650 mg total) by mouth every 6 (six) hours  as needed (for pain scale < 4).   ibuprofen 600 MG tablet Commonly known as: ADVIL Take 1 tablet (600 mg total) by mouth every 6 (six) hours as needed.   levothyroxine 150 MCG tablet Commonly known as: SYNTHROID Take 150 mcg by mouth daily.   PRENATAL VITAMIN PO Take 1 tablet by mouth daily.        Discharge home in stable condition Infant Feeding: Breast Infant Disposition:home with mother Discharge instruction: per After Visit Summary and Postpartum booklet. Activity: Advance as tolerated. Pelvic rest for 6 weeks.  Diet: routine diet Anticipated Birth Control: Unsure Postpartum Appointment:6 weeks Additional Postpartum F/U:  Future Appointments:No future appointments. Follow up Visit:      01/27/2020 Allena Katz, MD

## 2020-01-27 NOTE — Lactation Note (Signed)
This note was copied from a baby's chart. Lactation Consultation Note  Patient Name: Girl Odile Veloso [id number] Date: 01/27/2020 Reason for consult: Follow-up assessment;Term  P2 mother whose infant is now 63 hours old.  This is a term baby at 39+2 weeks.  Mother breast fed her first child (now 38 years old) for one year.  She had to use a NS during this time frame.  Baby has an 8% weight loss.  Baby was asleep in mother's arms when I arrived.  NP requested a lactation consult.    Discussed feeding plan with the parents.  Mother has had some difficulty with latching due to short shafted nipples.  The right side has been more difficult and baby has not latched well.  However, last night the parents began using a NS and stated that their daughter has latched and fed well with the shield.  Mother is familiar with hand expression and has a manual pump at bedside which she has also been using.  She can express colostrum.  Suggested that mother begin using the DEBP immediately after breast feeding for 15 minutes per session.  Explained benefits to doing this and mother happy to begin pumping.  Pump parts, assembly, disassembly and cleaning reviewed.  Mother informed me that she uses a #30 flange size and these were provided.  Offered to return when baby awakens if needed.  Both parents very receptive to learning and happy to comply.    Update provided to RN and NP.  Baby will most likely be discharged today after renal ultrasound is completed; will wait until later for confirmation.  Father has a pediatric visit for early tomorrow morning.  Mother will call for assistance as needed.   Maternal Data    Feeding Feeding Type: Breast Fed  LATCH Score Latch: Repeated attempts needed to sustain latch, nipple held in mouth throughout feeding, stimulation needed to elicit sucking reflex.  Audible Swallowing: Spontaneous and intermittent  Type of Nipple: Flat  Comfort (Breast/Nipple): Soft /  non-tender  Hold (Positioning): Assistance needed to correctly position infant at breast and maintain latch.  LATCH Score: 7  Interventions    Lactation Tools Discussed/Used     Consult Status Consult Status: Follow-up Date: 01/28/20 Follow-up type: In-patient    Helon Wisinski R Jakoby Melendrez 01/27/2020, 10:35 AM

## 2020-02-11 DIAGNOSIS — M5430 Sciatica, unspecified side: Secondary | ICD-10-CM | POA: Diagnosis not present

## 2020-03-03 DIAGNOSIS — Z1389 Encounter for screening for other disorder: Secondary | ICD-10-CM | POA: Diagnosis not present

## 2020-03-27 DIAGNOSIS — Z30430 Encounter for insertion of intrauterine contraceptive device: Secondary | ICD-10-CM | POA: Diagnosis not present

## 2020-04-18 DIAGNOSIS — E039 Hypothyroidism, unspecified: Secondary | ICD-10-CM | POA: Diagnosis not present

## 2020-05-26 DIAGNOSIS — E039 Hypothyroidism, unspecified: Secondary | ICD-10-CM | POA: Diagnosis not present

## 2020-05-29 DIAGNOSIS — E039 Hypothyroidism, unspecified: Secondary | ICD-10-CM | POA: Diagnosis not present

## 2020-05-29 DIAGNOSIS — E063 Autoimmune thyroiditis: Secondary | ICD-10-CM | POA: Diagnosis not present

## 2020-06-30 DIAGNOSIS — Z23 Encounter for immunization: Secondary | ICD-10-CM | POA: Diagnosis not present

## 2020-09-11 DIAGNOSIS — E039 Hypothyroidism, unspecified: Secondary | ICD-10-CM | POA: Diagnosis not present

## 2020-10-26 DIAGNOSIS — E039 Hypothyroidism, unspecified: Secondary | ICD-10-CM | POA: Diagnosis not present

## 2021-01-02 DIAGNOSIS — E039 Hypothyroidism, unspecified: Secondary | ICD-10-CM | POA: Diagnosis not present

## 2021-03-08 IMAGING — US US MFM OB FOLLOW-UP
1 series · 1 of 1 positions shown · non-contrast
Comparison: none

[Series 1: us mfm ob follow-up · 1 of 1 slices shown]
[im 1/1]
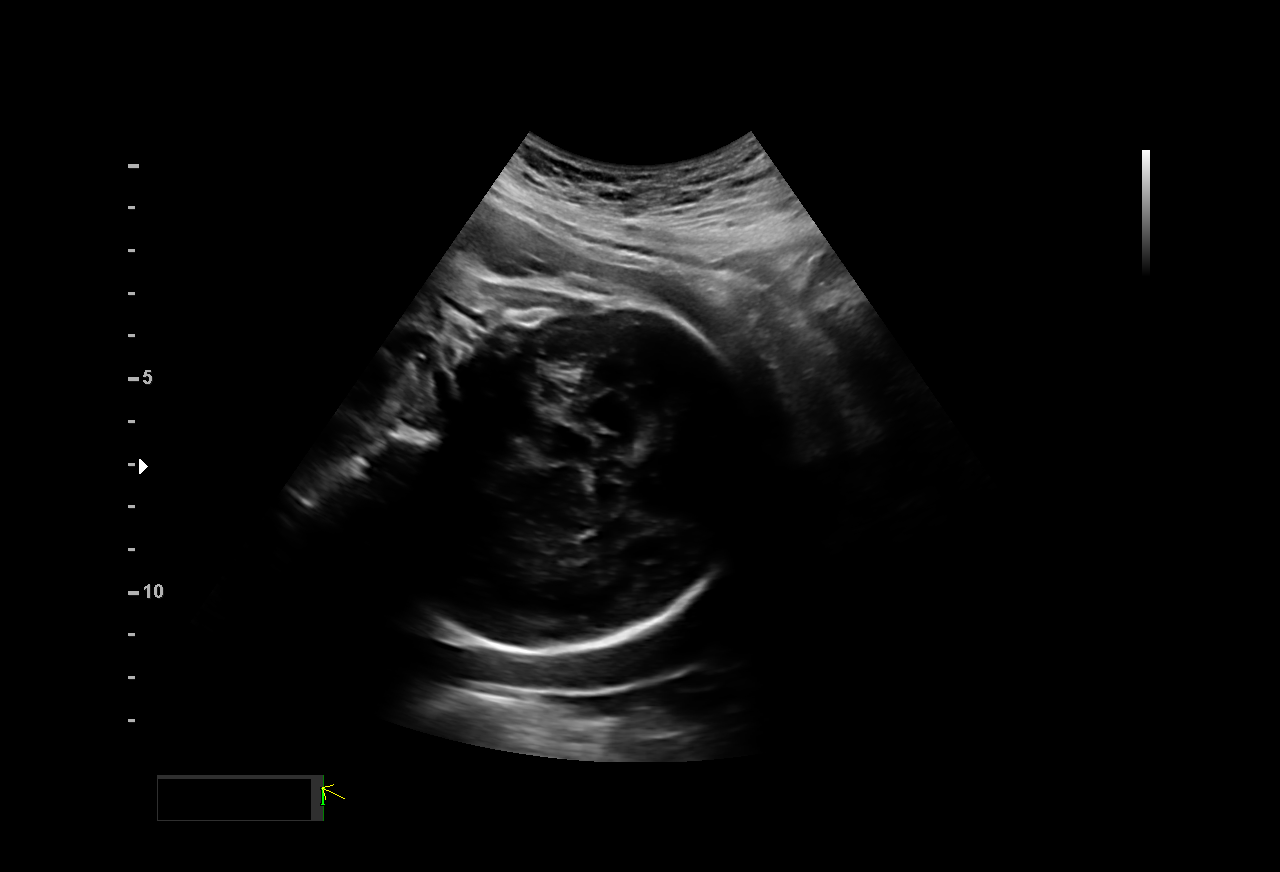

[1 of 1 positions shown; findings below may reference images not displayed]

SUZANAI

Indications

 Fetal abnormality - other known or
 suspected ( AGENESIS OF RT KIDNEY)
 31 weeks gestation of pregnancy
 Hypothyroid
 Maternal morbid obesity
 NEG NIPS, NEG AFP
 Encounter for other antenatal screening
 follow-up
Fetal Evaluation

 Num Of Fetuses:         1
 Fetal Heart Rate(bpm):  131
 Cardiac Activity:       Observed
 Presentation:           Cephalic
 Placenta:               Posterior Fundal
 P. Cord Insertion:      Previously Visualized

 Amniotic Fluid
 AFI FV:      Within normal limits

 AFI Sum(cm)     %Tile       Largest Pocket(cm)
 13.9            46          5.

 RUQ(cm)       RLQ(cm)       LUQ(cm)        LLQ(cm)
 5             3.5           4
Biometry

 BPD:        81  mm     G. Age:  32w 4d         73  %    CI:        80.63   %    70 - 86
                                                         FL/HC:      21.4   %    19.3 -
 HC:      284.9  mm     G. Age:  31w 2d         13  %    HC/AC:      1.00        0.96 -
 AC:       286   mm     G. Age:  32w 4d         81  %    FL/BPD:     75.2   %    71 - 87
 FL:       60.9  mm     G. Age:  31w 4d         42  %    FL/AC:      21.3   %    20 - 24
 HUM:      55.3  mm     G. Age:  32w 1d         66  %
 LV:          4  mm

 Est. FW:    0185  gm      4 lb 4 oz     64  %
OB History

 Gravidity:    4         Term:   1         SAB:   1
 TOP:          1        Living:  1
Gestational Age

 Clinical EDD:  31w 3d                                        EDD:   01/30/20
 U/S Today:     32w 0d                                        EDD:   01/26/20
 Best:          31w 3d     Det. By:  Clinical EDD             EDD:   01/30/20
Anatomy

 Cranium:               Appears normal         LVOT:                   Previously seen
 Cavum:                 Previously seen        Aortic Arch:            Previously seen
 Ventricles:            Appears normal         Ductal Arch:            Previously seen
 Choroid Plexus:        Previously seen        Diaphragm:              Previously seen
 Cerebellum:            Previously seen        Stomach:                Appears normal, left
                                                                       sided
 Posterior Fossa:       Previously seen        Abdomen:                Appears normal
 Nuchal Fold:           Previously seen        Abdominal Wall:         Previously seen
 Face:                  Orbits and profile     Cord Vessels:           2 Vessel Cord
                        previously seen
 Lips:                  Previously seen        Kidneys:                Absent right kidney
 Palate:                Previously seen        Bladder:                Appears normal
 Thoracic:              Previously seen        Spine:                  Ltd views no
                                                                       intracranial signs of
                                                                       NTD
 Heart:                 Previously seen        Upper Extremities:      Previously seen
 RVOT:                  Previously seen        Lower Extremities:      Previously seen

 Other:  Technically difficult due to fetal position. Nasal bone prev visualized.
         Female gender prev seen
Cervix Uterus Adnexa

 Cervix
 Not visualized (advanced GA >11wks)
Impression

 Single umbilical artery and absent right kidney where
 diagnosed on previous scans.  Patient return for fetal growth
 assessment.
 Amniotic fluid is normal and good fetal activity is seen .Fetal
 growth is appropriate for gestational age .  Left kidney seen.
 Right renal fossa is empty indicating absent right kidney.  No
 pelvic kidney seen.  Single umbilical artery is seen again.
 I have reassured the patient of normal fetal growth.
Recommendations

 Follow-up growth scan in 4 weeks that may be performed at
 your office.
                 Jumper, Blain

## 2021-03-09 DIAGNOSIS — Z23 Encounter for immunization: Secondary | ICD-10-CM | POA: Diagnosis not present

## 2021-03-13 DIAGNOSIS — E039 Hypothyroidism, unspecified: Secondary | ICD-10-CM | POA: Diagnosis not present

## 2021-04-25 DIAGNOSIS — Z23 Encounter for immunization: Secondary | ICD-10-CM | POA: Diagnosis not present

## 2021-05-30 DIAGNOSIS — E039 Hypothyroidism, unspecified: Secondary | ICD-10-CM | POA: Diagnosis not present

## 2021-06-05 DIAGNOSIS — E039 Hypothyroidism, unspecified: Secondary | ICD-10-CM | POA: Diagnosis not present

## 2021-06-05 DIAGNOSIS — E063 Autoimmune thyroiditis: Secondary | ICD-10-CM | POA: Diagnosis not present

## 2021-09-27 DIAGNOSIS — E039 Hypothyroidism, unspecified: Secondary | ICD-10-CM | POA: Diagnosis not present

## 2022-06-07 DIAGNOSIS — E039 Hypothyroidism, unspecified: Secondary | ICD-10-CM | POA: Diagnosis not present

## 2022-08-01 ENCOUNTER — Other Ambulatory Visit: Payer: Self-pay | Admitting: Family Medicine

## 2022-08-01 DIAGNOSIS — Z1231 Encounter for screening mammogram for malignant neoplasm of breast: Secondary | ICD-10-CM

## 2022-08-06 ENCOUNTER — Ambulatory Visit
Admission: RE | Admit: 2022-08-06 | Discharge: 2022-08-06 | Disposition: A | Discharge status: Home / Self Care | Payer: No Typology Code available for payment source | Source: Ambulatory Visit | Attending: Family Medicine | Admitting: Family Medicine

## 2022-08-06 DIAGNOSIS — Z1231 Encounter for screening mammogram for malignant neoplasm of breast: Secondary | ICD-10-CM

## 2023-09-08 ENCOUNTER — Other Ambulatory Visit: Payer: Self-pay | Admitting: Family Medicine

## 2023-09-08 DIAGNOSIS — Z1231 Encounter for screening mammogram for malignant neoplasm of breast: Secondary | ICD-10-CM

## 2023-09-09 ENCOUNTER — Ambulatory Visit
Admission: RE | Admit: 2023-09-09 | Discharge: 2023-09-09 | Disposition: A | Discharge status: Home / Self Care | Source: Ambulatory Visit | Attending: Family Medicine | Admitting: Family Medicine

## 2023-09-09 DIAGNOSIS — Z1231 Encounter for screening mammogram for malignant neoplasm of breast: Secondary | ICD-10-CM
